# Patient Record
Sex: Male | Born: 1980 | Hispanic: Yes | Marital: Single | Smoking: Current every day smoker
Health system: Southern US, Community
[De-identification: ages and names within clinical notes are randomized; demographics above are authoritative.]

## PROBLEM LIST (undated history)

## (undated) ENCOUNTER — Ambulatory Visit: Admission: EM | Payer: Commercial Managed Care - PPO | Source: Home / Self Care

## (undated) DIAGNOSIS — M1990 Unspecified osteoarthritis, unspecified site: Secondary | ICD-10-CM

## (undated) DIAGNOSIS — T7840XA Allergy, unspecified, initial encounter: Secondary | ICD-10-CM

## (undated) HISTORY — DX: Unspecified osteoarthritis, unspecified site: M19.90

## (undated) HISTORY — DX: Allergy, unspecified, initial encounter: T78.40XA

---

## 2016-05-16 ENCOUNTER — Ambulatory Visit (INDEPENDENT_AMBULATORY_CARE_PROVIDER_SITE_OTHER): Payer: Commercial Managed Care - PPO | Admitting: Urgent Care

## 2016-05-16 VITALS — BP 132/88 | HR 64 | Temp 98.4°F | Resp 18 | Ht 65.0 in | Wt 170.5 lb

## 2016-05-16 DIAGNOSIS — M542 Cervicalgia: Secondary | ICD-10-CM

## 2016-05-16 DIAGNOSIS — R6889 Other general symptoms and signs: Secondary | ICD-10-CM

## 2016-05-16 DIAGNOSIS — M791 Myalgia, unspecified site: Secondary | ICD-10-CM

## 2016-05-16 DIAGNOSIS — M25512 Pain in left shoulder: Secondary | ICD-10-CM

## 2016-05-16 DIAGNOSIS — G44209 Tension-type headache, unspecified, not intractable: Secondary | ICD-10-CM

## 2016-05-16 DIAGNOSIS — H938X3 Other specified disorders of ear, bilateral: Secondary | ICD-10-CM | POA: Diagnosis not present

## 2016-05-16 MED ORDER — CYCLOBENZAPRINE HCL 5 MG PO TABS: 5.0000 mg | ORAL_TABLET | Freq: Three times a day (TID) | ORAL | 5 refills | Status: DC | PRN

## 2016-05-16 MED ORDER — NAPROXEN SODIUM 550 MG PO TABS: 550.0000 mg | ORAL_TABLET | Freq: Two times a day (BID) | ORAL | 1 refills | Status: DC

## 2016-05-16 MED ORDER — CETIRIZINE HCL 10 MG PO TABS
10.0000 mg | ORAL_TABLET | Freq: Every day | ORAL | 11 refills | Status: DC
Start: 2016-05-16 — End: 2016-12-11

## 2016-05-16 MED ORDER — PSEUDOEPHEDRINE HCL ER 120 MG PO TB12: 120.0000 mg | ORAL_TABLET | Freq: Two times a day (BID) | ORAL | 3 refills | Status: DC

## 2016-05-16 NOTE — Patient Instructions (Addendum)
Tome  de Tylenol con ibuprofen 400-600mg  cada 6 horas con comida para dolor y inflammacion. Si todavia tiene dolor aun con estos medicamentos use naproxen (Anaprox). Puede usar Flexeril todos los dias para sus musculos.   Para congestion, alergia, use Zyrtec y Sudafed.    IF you received an x-ray today, you will receive an invoice from [facility]. Please contact [facility] at [phone] with questions or concerns regarding your invoice.   IF you received labwork today, you will receive an invoice from [facility]. Please contact [facility] at [phone] with questions or concerns regarding your invoice.   Our billing staff will not be able to assist you with questions regarding bills from these companies.  You will be contacted with the lab results as soon as they are available. The fastest way to get your results is to activate your My Chart account. Instructions are located on the last page of this paperwork. If you have not heard from [place] regarding the results in 2 weeks, please contact this office.

## 2016-05-16 NOTE — Progress Notes (Signed)
MRN: [id number] DOB: 22-Sep-1980  Subjective:   Gabriel Raymond. is a 36 y.o. male presenting for chief complaint of Fever (& cold sx's x 3 days)  Illness - Reports 1 week history of subjective fever, chills, had dysuria initially with associated darker urine, severe body aches. His ears feel like they have water, nasal congestion. Symptoms have improved except for a posterior headache, tension over his trapezius, congestion and ear symptoms. Tried otc cold/flu medications, Thera-flu. Has a history of seasonal allergies, uses Claritin as needed. Denies sinus pain, sinus congestion, ear pain, ear drainage, sore throat, cough, chest pain, shob, wheezing, n/v, abdominal pain, diarrhea, rashes. Smokes 1/4 pack of cigarettes daily. Has a remote history of pneumonia ~10 years ago. Has not had his flu shot this season.  Joint pain - Patient works as a Education administrator. Has strenuous work load. Uses his neck, shoulders a lot. Has had persistent neck aching and left shoulder pain. Admits that he carries heavy object with his upper back and left shoulder for work. Admits that his headache may be related to his strenuous work. Denies trauma, swelling, confusion, dizziness, numbness or tingling, decreased range of motion.  Gabriel Raymond takes an otc sleep medication. Also has No Known Allergies. Gabriel Raymond  has a past medical history of Allergy and Arthritis. Also denies past surgical history. Denies family history of cancer, diabetes, HTN, HL, heart disease, stroke, mental illness.   Objective:   Vitals: BP 132/88   Pulse 64   Temp 98.4 F (36.9 C) (Oral)   Resp 18   Ht  (1.651 m)   Wt 170 lb 8 oz (77.3 kg)   SpO2 98%   BMI 28.37 kg/m   Physical Exam  Constitutional: He is oriented to person, place, and time. He appears well-developed and well-nourished.  HENT:  TM's flat but intact bilaterally, no effusions or erythema. Nasal turbinates boggy and edematous, nasal passages minimally patent. No sinus  tenderness. Oropharynx with mild post-nasal drainage, mucous membranes moist, dentition in good repair.  Eyes: Right eye exhibits no discharge. Left eye exhibits no discharge.  Neck: Normal range of motion. Neck supple.  Cardiovascular: Normal rate, regular rhythm and intact distal pulses.  Exam reveals no gallop and no friction rub.   No murmur heard. Pulmonary/Chest: No respiratory distress. He has no wheezes. He has no rales.  Musculoskeletal:       Left shoulder: He exhibits decreased range of motion (full abduction, flexion and extension). He exhibits no tenderness, no bony tenderness, no swelling, no effusion, no crepitus, no deformity, no laceration, no spasm and normal strength.       Cervical back: He exhibits tenderness (over paraspinal mucles) and spasm. He exhibits normal range of motion, no bony tenderness, no swelling, no edema, no deformity and no laceration.  Lymphadenopathy:    He has no cervical adenopathy.  Neurological: He is alert and oriented to person, place, and time. Coordination normal.  Skin: Skin is warm and dry.   Assessment and Plan :   1. Flu-like symptoms 2. Acute non intractable tension-type headache 3. Sensation of fullness in both ears - Likely had flu. Recommended supportive care. Will try to address underlying allergies with Zyrtec and Sudafed. Patient is to rtc in 1 week if no improvement.  4. Myalgia 5. Neck pain 6. Pain in joint of left shoulder - Will use conservative management with NSAID, muscle relaxant. Patient will try to modify work activities. Will rtc if no improvement and consider x-rays at that point.  Wallis Bamberg, PA-C Primary Care at [facility] [phone] 05/16/2016  11:58 AM

## 2016-12-11 ENCOUNTER — Encounter: Payer: Self-pay | Admitting: Family Medicine

## 2016-12-11 ENCOUNTER — Ambulatory Visit (INDEPENDENT_AMBULATORY_CARE_PROVIDER_SITE_OTHER): Payer: Commercial Managed Care - PPO | Admitting: Family Medicine

## 2016-12-11 ENCOUNTER — Telehealth: Payer: Self-pay | Admitting: *Deleted

## 2016-12-11 VITALS — BP 120/78 | HR 76 | Temp 97.8°F | Resp 16 | Ht 65.0 in | Wt 174.0 lb

## 2016-12-11 DIAGNOSIS — G8929 Other chronic pain: Secondary | ICD-10-CM

## 2016-12-11 DIAGNOSIS — M545 Low back pain, unspecified: Secondary | ICD-10-CM

## 2016-12-11 MED ORDER — NAPROXEN SODIUM 550 MG PO TABS: 550.0000 mg | ORAL_TABLET | Freq: Two times a day (BID) | ORAL | 0 refills | Status: AC

## 2016-12-11 MED ORDER — CYCLOBENZAPRINE HCL 5 MG PO TABS: 5.0000 mg | ORAL_TABLET | Freq: Three times a day (TID) | ORAL | 0 refills | Status: AC | PRN

## 2016-12-11 NOTE — Telephone Encounter (Signed)
Faxed letter concerning work to [employer] (supervisor), the patient asked letter be faxed. Confirmation page received at 3:49 pm.

## 2016-12-11 NOTE — Progress Notes (Signed)
   10/26/20185:55 PM  Gabriel RilesJamie Waltman Jr. November 18, 1980, 36 y.o. male [id number]  Chief Complaint  Patient presents with  . Back Pain    x1 week left sided  . Abdominal Pain    c/o abdominal distention    HPI:  Patient here c/o of 1 week of left lower back pain, does not radiate. No issues with bowel or bladder, numbness, tingling or weakness. He reports that about 5 years ago he injured his back while at work. Was managed with chiropractic care, taught him exercises. He states that he tends to have issues with his back every year during this season, as it starts getting colder. This time his back started bothering him about a week ago, he started doing his exercises. Today at work his boss asked him to help him move heavy equipment and patient declined due to fear of hurting his back even more. He is here for further evaluation.    [facility] 12/11/2016 05/16/2016  Decreased Interest 0 0  Down, Depressed, Hopeless 0 0  PHQ - 2 Score 0 0    No Known Allergies  Prior to Admission medications   Medication Sig Start Date End Date Taking? Authorizing Provider    Past Medical History:  Diagnosis Date  . Allergy   . Arthritis     History reviewed. No pertinent surgical history.  Social History  Substance Use Topics  . Smoking status: Current Every Day Smoker  . Smokeless tobacco: Never Used  . Alcohol use No    History reviewed. No pertinent family history.  ROS Per hpi  OBJECTIVE:  Blood pressure 120/78, pulse 76, temperature 97.8 F (36.6 C), temperature source Oral, resp. rate 16, height 5\' 5"  (1.651 m), weight 174 lb (78.9 kg), SpO2 100 %.  Physical Exam GEN: AAOx3, NAD CV: RRR no m/r/g Resp: CTAB no w/r/r Back: FROM, no bony TTP, + TTP and spasms on left lumbar paraspinal. No SI or piriformis TTP. Neg SLR.  Neuro: LE strength, sensation and reflexes are normal.  ASSESSMENT and PLAN 1. Chronic left-sided low back pain without sciatica Continue  with conservative measures. Work restriction for 30 lbs max weight lifting for 1 week given.   - naproxen sodium (ANAPROX DS) 550 MG tablet; Take 1 tablet (550 mg total) by mouth 2 (two) times daily with a meal. - cyclobenzaprine (FLEXERIL) 5 MG tablet; Take 1 tablet (5 mg total) by mouth 3 (three) times daily as needed.  Return if symptoms worsen or fail to improve.    Myles LippsIrma M Santiago, MD [facility] [address] [place], [place] [address] Ph.  [phone] Fax [phone]

## 2016-12-11 NOTE — Patient Instructions (Signed)
     IF you received an x-ray today, you will receive an invoice from [facility]. Please contact [facility] at [phone] with questions or concerns regarding your invoice.   IF you received labwork today, you will receive an invoice from [facility]. Please contact [facility] at [phone] with questions or concerns regarding your invoice.   Our billing staff will not be able to assist you with questions regarding bills from these companies.  You will be contacted with the lab results as soon as they are available. The fastest way to get your results is to activate your My Chart account. Instructions are located on the last page of this paperwork. If you have not heard from us regarding the results in 2 weeks, please contact this office.     

## 2016-12-18 ENCOUNTER — Ambulatory Visit (INDEPENDENT_AMBULATORY_CARE_PROVIDER_SITE_OTHER): Payer: Commercial Managed Care - PPO | Admitting: Family Medicine

## 2016-12-18 ENCOUNTER — Encounter: Payer: Self-pay | Admitting: Family Medicine

## 2016-12-18 VITALS — BP 122/70 | HR 110 | Temp 98.5°F | Resp 18 | Ht 65.0 in | Wt 178.0 lb

## 2016-12-18 DIAGNOSIS — R3129 Other microscopic hematuria: Secondary | ICD-10-CM | POA: Diagnosis not present

## 2016-12-18 DIAGNOSIS — M545 Low back pain, unspecified: Secondary | ICD-10-CM

## 2016-12-18 DIAGNOSIS — G8929 Other chronic pain: Secondary | ICD-10-CM

## 2016-12-18 DIAGNOSIS — R350 Frequency of micturition: Secondary | ICD-10-CM | POA: Diagnosis not present

## 2016-12-18 DIAGNOSIS — K5900 Constipation, unspecified: Secondary | ICD-10-CM

## 2016-12-18 LAB — POCT URINALYSIS DIP (MANUAL ENTRY)
Bilirubin, UA: NEGATIVE
Glucose, UA: NEGATIVE mg/dL
Ketones, POC UA: NEGATIVE mg/dL
Leukocytes, UA: NEGATIVE
Nitrite, UA: NEGATIVE
Protein Ur, POC: NEGATIVE mg/dL
Spec Grav, UA: 1.025 (ref 1.010–1.025)
Urobilinogen, UA: 4 E.U./dL — AB
pH, UA: 6 (ref 5.0–8.0)

## 2016-12-18 LAB — POC MICROSCOPIC URINALYSIS (UMFC)

## 2016-12-18 MED ORDER — POLYETHYLENE GLYCOL 3350 17 GM/SCOOP PO POWD: 17.0000 g | Freq: Every day | ORAL | 1 refills | Status: AC

## 2016-12-18 NOTE — Progress Notes (Signed)
11/2/20184:27 PM  Gabriel Raymond. [birth year], 36 y.o. male [id number]  Chief Complaint  Patient presents with  . Back Pain    follow up for note so pt can go back to work     HPI:   Patient is a 36 y.o. male with past medical history significant for mechanical low back pain who presents today for work release. Last seen a week ago, had a flare up of symptoms, placed on lifting restrictions, started on conservative measures. Today reports symptoms resolved. Requesting lifting restrictions to be removed.   He also reports urinary frequency, urine feels hot, and left sided abdominal discomfort, not painful or colicky. Reports having issues with constipation. Has not had a normal stool in over 4 days. Today had a very small stool, and that helped left sided abd pain. Denies any nausea, vomiting or fever.   Depression screen [facility] 2/9 12/11/2016 05/16/2016  Decreased Interest 0 0  Down, Depressed, Hopeless 0 0  PHQ - 2 Score 0 0    No Known Allergies  Prior to Admission medications   Medication Sig Start Date End Date Taking? Authorizing Provider  cyclobenzaprine (FLEXERIL) 5 MG tablet Take 1 tablet (5 mg total) by mouth 3 (three) times daily as needed. 12/11/16  Yes Myles Lipps, MD  naproxen sodium (ANAPROX DS) 550 MG tablet Take 1 tablet (550 mg total) by mouth 2 (two) times daily with a meal. 12/11/16  Yes Myles Lipps, MD    Past Medical History:  Diagnosis Date  . Allergy   . Arthritis     History reviewed. No pertinent surgical history.  Social History  Substance Use Topics  . Smoking status: Current Every Day Smoker  . Smokeless tobacco: Never Used  . Alcohol use No    History reviewed. No pertinent family history.  Review of Systems  Constitutional: Negative for chills and fever.  Respiratory: Negative for cough and shortness of breath.   Cardiovascular: Negative for chest pain, palpitations and leg swelling.  Gastrointestinal: Positive for abdominal  pain and constipation. Negative for blood in stool, diarrhea, melena, nausea and vomiting.  Genitourinary: Positive for frequency. Negative for dysuria, flank pain, hematuria and urgency.     OBJECTIVE:  Blood pressure 122/70, pulse (!) 110, temperature 98.5 F (36.9 C), temperature source Oral, resp. rate 18, height 5\' 5"  (1.651 m), weight 178 lb (80.7 kg), SpO2 95 %.  Physical Exam  Constitutional: He is oriented to person, place, and time and well-developed, well-nourished, and in no distress.  HENT:  Head: Normocephalic and atraumatic.  Mouth/Throat: Oropharynx is clear and moist.  Eyes: EOM are normal. Pupils are equal, round, and reactive to light.  Neck: Neck supple.  Cardiovascular: Normal rate and regular rhythm. Exam reveals no gallop and no friction rub.  No murmur heard. Pulmonary/Chest: Effort normal and breath sounds normal. He has no wheezes. He has no rales.  Abdominal: Soft. Bowel sounds are normal. He exhibits no distension. There is no hepatosplenomegaly. There is tenderness in the left upper quadrant and left lower quadrant. There is no rigidity, no rebound, no guarding and no CVA tenderness.  Musculoskeletal:       Lumbar back: Normal.  Neurological: He is alert and oriented to person, place, and time. Gait normal.  Skin: Skin is warm and dry.      Results for orders placed or performed in visit on 12/18/16 (from the past 24 hour(s))  POCT urinalysis dipstick     Status: Abnormal  Collection Time: 12/18/16  4:38 PM  Result Value Ref Range   Color, UA yellow yellow   Clarity, UA clear clear   Glucose, UA negative negative mg/dL   Bilirubin, UA negative negative   Ketones, POC UA negative negative mg/dL   Spec Grav, UA [id number] - 1.025   Blood, UA moderate (A) negative   pH, UA 6.0 5.0 - 8.0   Protein Ur, POC negative negative mg/dL   Urobilinogen, UA 4.0 (A) 0.2 or 1.0 E.U./dL   Nitrite, UA Negative Negative   Leukocytes, UA Negative Negative  POCT  Microscopic Urinalysis ([facility])     Status: Abnormal   Collection Time: 12/18/16  5:18 PM  Result Value Ref Range   WBC,UR,HPF,POC None None WBC/hpf   RBC,UR,HPF,POC Moderate (A) None RBC/hpf   Bacteria Few (A) None, Too numerous to count   Mucus Present (A) Absent   Epithelial Cells, UR Per Microscopy Few (A) None, Too numerous to count cells/hpf     ASSESSMENT and PLAN  1. Chronic left-sided low back pain without sciatica - Work release given.  2. Constipation, unspecified constipation type Discussed importance of hydration and fiber, trial of miralax, if not responding discussed OTC mag citrate. RTC precautions given. - polyethylene glycol powder (GLYCOLAX/MIRALAX) powder; Take 17 g by mouth daily.  3. Urinary frequency - POCT urinalysis dipstick - no signs of infection but mod blood present.  4. Microscopic hematuria Unclear etiology, will start with lab work and CT, this might be a very mild presentation of nephrolithiasis. - POCT Microscopic Urinalysis ([facility]) - CBC with Differential - Comprehensive metabolic panel - CT Abdomen Pelvis Wo Contrast; Future   Return in about 4 weeks (around 01/15/2017) for hemturia.    Myles LippsIrma M Santiago, MD [facility] [address] [place], [place] [address] Ph.  [phone] Fax [phone]

## 2016-12-18 NOTE — Patient Instructions (Signed)
     IF you received an x-ray today, you will receive an invoice from [facility]. Please contact [facility] at [phone] with questions or concerns regarding your invoice.   IF you received labwork today, you will receive an invoice from [facility]. Please contact [facility] at [phone] with questions or concerns regarding your invoice.   Our billing staff will not be able to assist you with questions regarding bills from these companies.  You will be contacted with the lab results as soon as they are available. The fastest way to get your results is to activate your My Chart account. Instructions are located on the last page of this paperwork. If you have not heard from us regarding the results in 2 weeks, please contact this office.     

## 2016-12-19 LAB — COMPREHENSIVE METABOLIC PANEL
ALT: 43 IU/L (ref 0–44)
AST: 40 IU/L (ref 0–40)
Albumin/Globulin Ratio: 1.7 (ref 1.2–2.2)
Albumin: 4.2 g/dL (ref 3.5–5.5)
Alkaline Phosphatase: 176 IU/L — ABNORMAL HIGH (ref 39–117)
BUN/Creatinine Ratio: 15 (ref 9–20)
BUN: 13 mg/dL (ref 6–20)
Bilirubin Total: 0.4 mg/dL (ref 0.0–1.2)
CO2: 25 mmol/L (ref 20–29)
Calcium: 8.8 mg/dL (ref 8.7–10.2)
Chloride: 100 mmol/L (ref 96–106)
Creatinine, Ser: 0.85 mg/dL (ref 0.76–1.27)
GFR calc Af Amer: 129 mL/min/{1.73_m2} (ref >59)
GFR calc non Af Amer: 111 mL/min/{1.73_m2} (ref >59)
Globulin, Total: 2.5 g/dL (ref 1.5–4.5)
Glucose: 98 mg/dL (ref 65–99)
Potassium: 4.1 mmol/L (ref 3.5–5.2)
Sodium: 142 mmol/L (ref 134–144)
Total Protein: 6.7 g/dL (ref 6.0–8.5)

## 2016-12-19 LAB — CBC WITH DIFFERENTIAL/PLATELET
Basophils Absolute: 0 10*3/uL (ref 0.0–0.2)
Basos: 0 %
EOS (ABSOLUTE): 0.8 10*3/uL — ABNORMAL HIGH (ref 0.0–0.4)
Eos: 11 %
Hematocrit: 42.6 % (ref 37.5–51.0)
Hemoglobin: 14.4 g/dL (ref 13.0–17.7)
Immature Grans (Abs): 0 10*3/uL (ref 0.0–0.1)
Immature Granulocytes: 0 %
Lymphocytes Absolute: 2.4 10*3/uL (ref 0.7–3.1)
Lymphs: 31 %
MCH: 29.4 pg (ref 26.6–33.0)
MCHC: 33.8 g/dL (ref 31.5–35.7)
MCV: 87 fL (ref 79–97)
Monocytes Absolute: 0.5 10*3/uL (ref 0.1–0.9)
Monocytes: 7 %
Neutrophils Absolute: 3.9 10*3/uL (ref 1.4–7.0)
Neutrophils: 51 %
Platelets: 180 10*3/uL (ref 150–379)
RBC: 4.89 x10E6/uL (ref 4.14–5.80)
RDW: 12.9 % (ref 12.3–15.4)
WBC: 7.6 10*3/uL (ref 3.4–10.8)

## 2016-12-20 ENCOUNTER — Encounter: Payer: Self-pay | Admitting: Family Medicine

## 2017-01-28 ENCOUNTER — Other Ambulatory Visit: Payer: Self-pay | Admitting: Family Medicine

## 2017-01-29 ENCOUNTER — Other Ambulatory Visit: Payer: Self-pay | Admitting: Family Medicine

## 2017-01-29 ENCOUNTER — Ambulatory Visit
Admission: RE | Admit: 2017-01-29 | Discharge: 2017-01-29 | Disposition: A | Discharge status: Home / Self Care | Payer: Commercial Managed Care - PPO | Source: Ambulatory Visit | Attending: Family Medicine | Admitting: Family Medicine

## 2017-01-29 DIAGNOSIS — R3129 Other microscopic hematuria: Secondary | ICD-10-CM

## 2017-01-30 ENCOUNTER — Other Ambulatory Visit: Payer: Self-pay | Admitting: Family Medicine

## 2017-01-30 MED ORDER — METRONIDAZOLE 500 MG PO TABS: 500.0000 mg | ORAL_TABLET | Freq: Three times a day (TID) | ORAL | 0 refills | Status: AC

## 2017-01-30 NOTE — Progress Notes (Signed)
Discussed ct results via phone. Started flagyl. Fu 2 weeks.

## 2019-02-04 IMAGING — CT CT ABD-PELV W/O CM
1 of 2 series · 15 of 32 positions shown, 19 images · non-contrast
Comparison: None.

CLINICAL DATA: Left-sided abdominal pain and back pain. Positive
hematuria.

EXAM:
CT ABDOMEN AND PELVIS WITHOUT CONTRAST
TECHNIQUE: Multidetector CT imaging of the abdomen and pelvis was performed
following the standard protocol without IV contrast.

[Series 2: abd/pelvis w/(date) · axial · 0.65mm/px · z∈[-430,-20]mm · 15 of 90 slices shown, 19 images]
[im 4/90  soft-tissue]
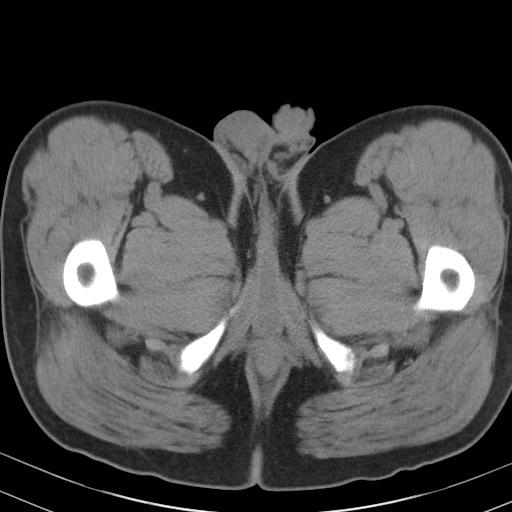
[im 4/90  bone]
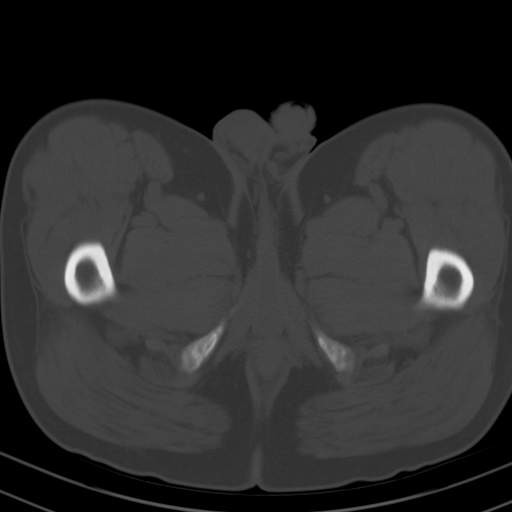
[im 12/90  soft-tissue]
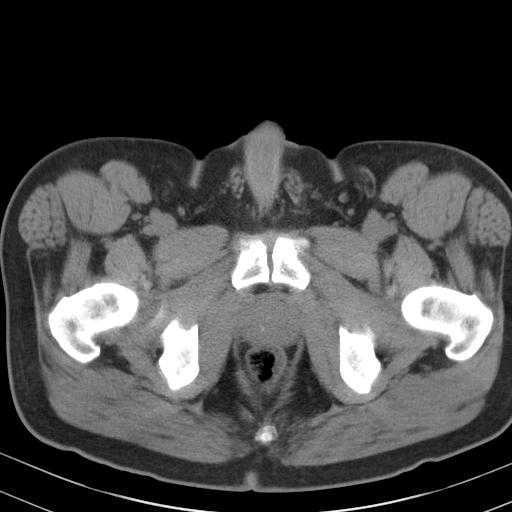
[im 19/90  soft-tissue]
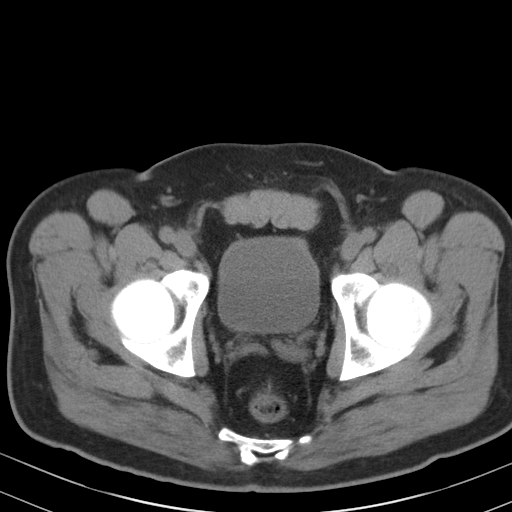
[im 26/90  soft-tissue]
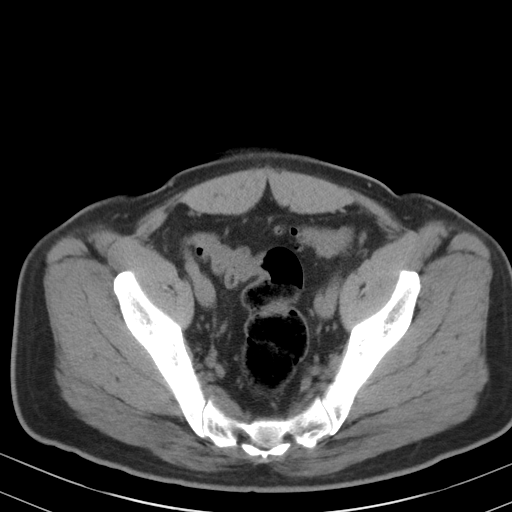
[im 30/90  soft-tissue]
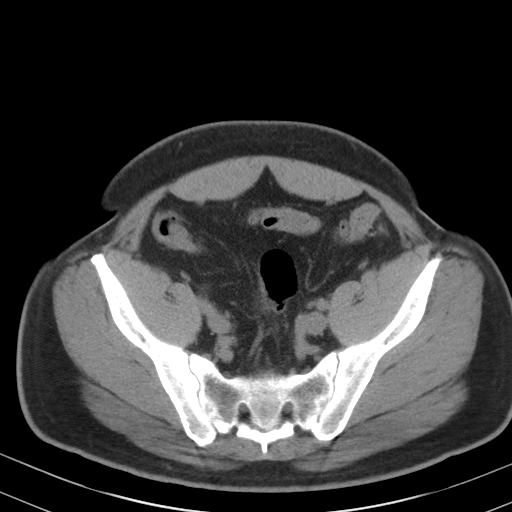
[im 38/90  soft-tissue]
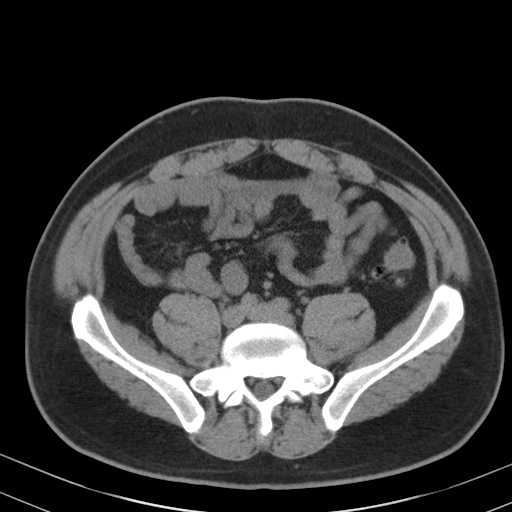
[im 45/90  soft-tissue]
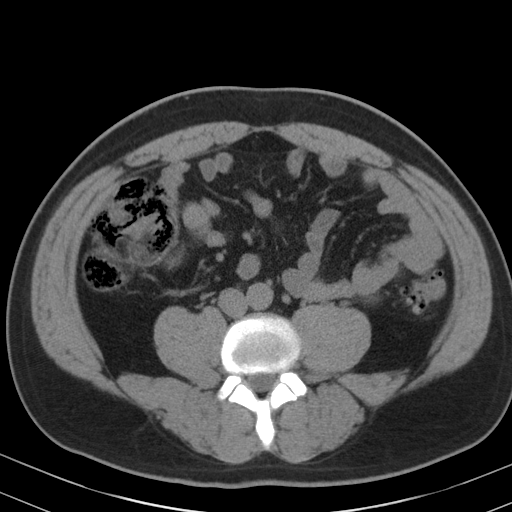
[im 52/90  soft-tissue]
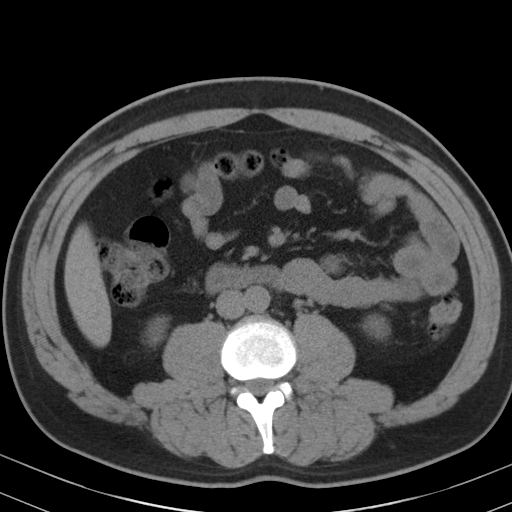
[im 60/90  soft-tissue]
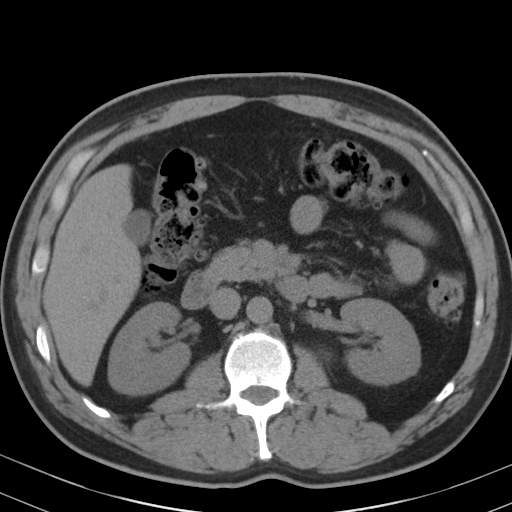
[im 60/90  bone]
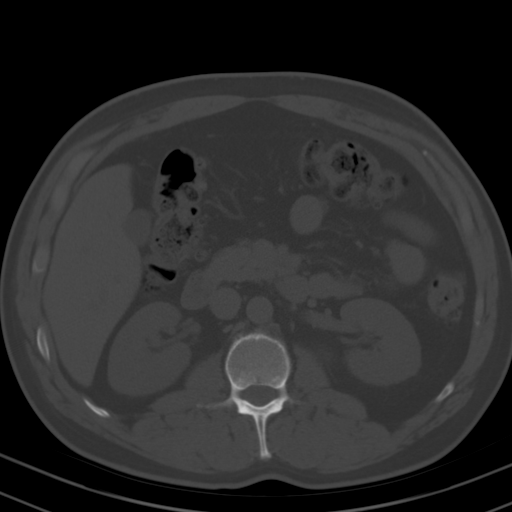
[im 64/90  soft-tissue]
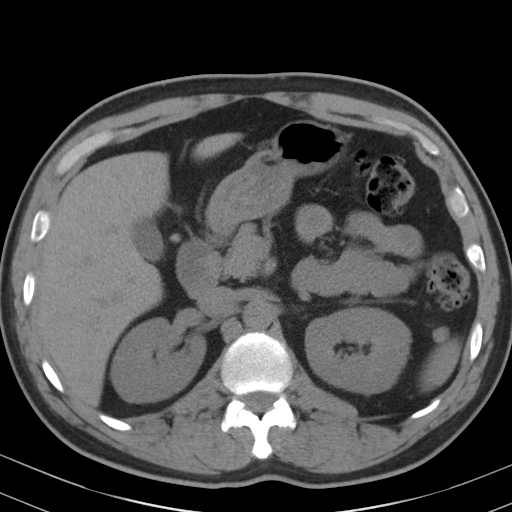
[im 71/90  soft-tissue]
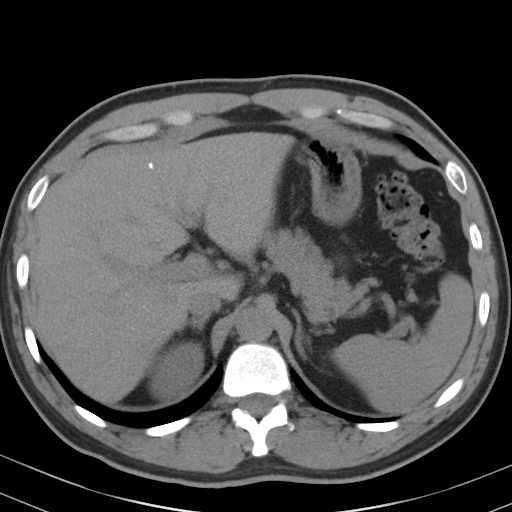
[im 75/90  lung]
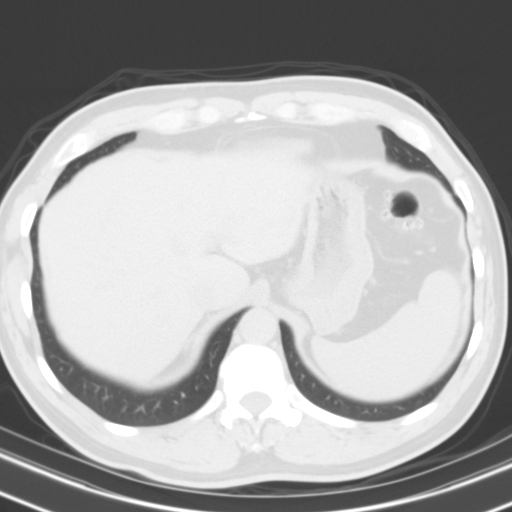
[im 78/90  soft-tissue]
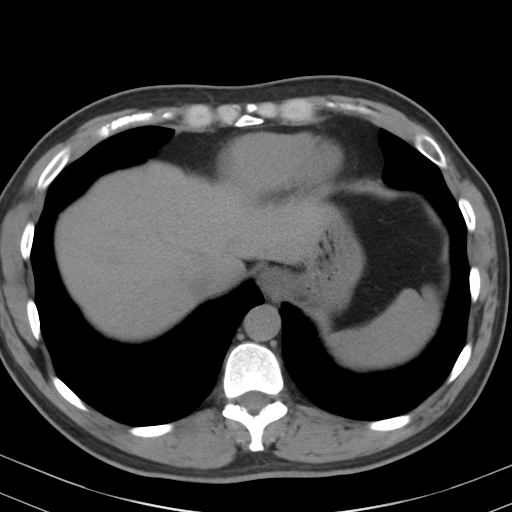
[im 78/90  lung]
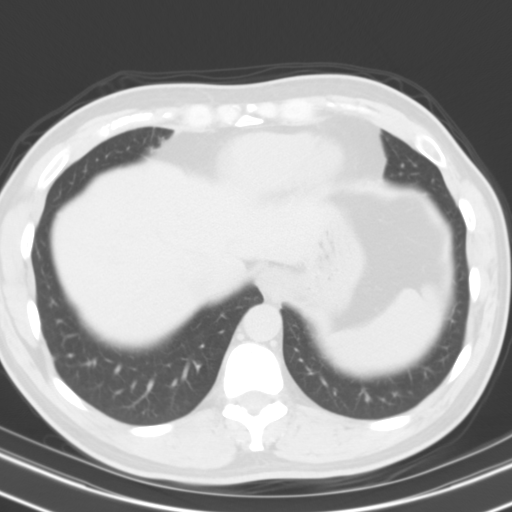
[im 82/90  lung]
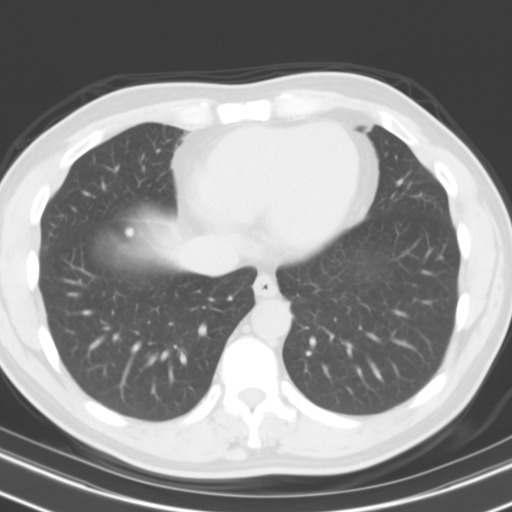
[im 86/90  soft-tissue]
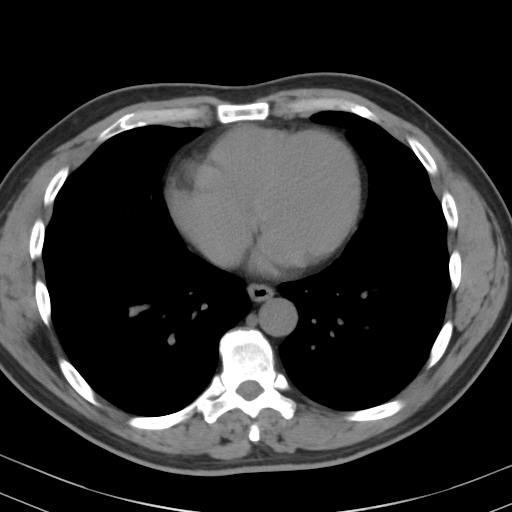
[im 86/90  lung]
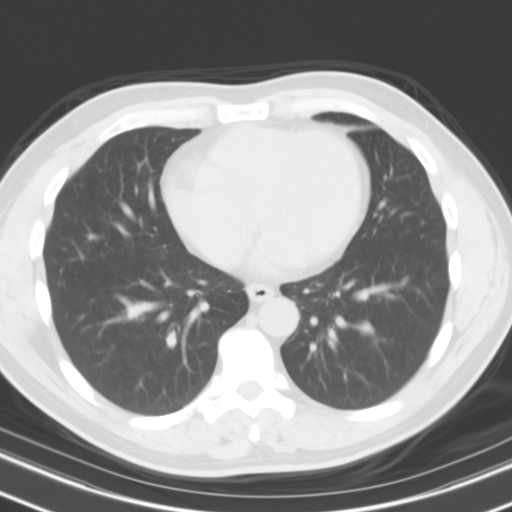

[15 of 32 positions shown; findings below may reference images not displayed]

FINDINGS: Lower chest: 6 mm para diaphragmatic right lower lobe pulmonary
nodule.

Hepatobiliary: Normal non contrasted opacification of the liver,
apart from dystrophic calcification in the dome. Normal appearance
of the gallbladder up

Pancreas: Unremarkable. No pancreatic ductal dilatation or
surrounding inflammatory changes.

Spleen: Normal in size without focal abnormality.

Adrenals/Urinary Tract: Adrenal glands are unremarkable. Kidneys are
without renal calculi, focal lesion, or hydronephrosis. 12 mm
exophytic right renal cyst. Bladder is unremarkable.

Stomach/Bowel: Stomach is within normal limits. Appendix appears
normal. No evidence of small bowel wall thickening, distention, or
inflammatory changes. Diffuse colonic diverticulosis. Mild
circumferential mucosal thickening of the descending colon may
represent an element of diverticulitis.

Vascular/Lymphatic: No significant vascular findings are present. No
enlarged abdominal or pelvic lymph nodes.

Reproductive: Prostate is unremarkable.

Other: Bilateral fat containing inguinal hernias. No abdominopelvic
ascites.

Musculoskeletal: No acute or significant osseous findings.
IMPRESSION: No evidence of obstructive uropathy or nephrolithiasis.

Diffuse colonic diverticulosis with probable mild descending colon
diverticulitis.
